# Patient Record
Sex: Female | Born: 1975 | Smoking: Never smoker
Health system: Southern US, Community
[De-identification: ages and names within clinical notes are randomized; demographics above are authoritative.]

## PROBLEM LIST (undated history)

## (undated) ENCOUNTER — Ambulatory Visit (HOSPITAL_COMMUNITY): Payer: Self-pay | Source: Home / Self Care

## (undated) HISTORY — PX: TUBAL LIGATION: SHX77

---

## 2013-05-14 ENCOUNTER — Other Ambulatory Visit (HOSPITAL_COMMUNITY)
Admission: RE | Admit: 2013-05-14 | Discharge: 2013-05-14 | Disposition: A | Discharge status: Home / Self Care | Payer: Self-pay | Source: Ambulatory Visit | Attending: Family Medicine | Admitting: Family Medicine

## 2013-05-14 DIAGNOSIS — Z124 Encounter for screening for malignant neoplasm of cervix: Secondary | ICD-10-CM | POA: Insufficient documentation

## 2013-05-14 DIAGNOSIS — Z1151 Encounter for screening for human papillomavirus (HPV): Secondary | ICD-10-CM | POA: Insufficient documentation

## 2016-08-17 ENCOUNTER — Other Ambulatory Visit: Payer: Self-pay | Admitting: Obstetrics and Gynecology

## 2016-08-17 DIAGNOSIS — Z1231 Encounter for screening mammogram for malignant neoplasm of breast: Secondary | ICD-10-CM

## 2016-09-03 ENCOUNTER — Encounter (HOSPITAL_COMMUNITY): Payer: Self-pay

## 2016-09-03 ENCOUNTER — Ambulatory Visit (HOSPITAL_COMMUNITY)
Admission: RE | Admit: 2016-09-03 | Discharge: 2016-09-03 | Disposition: A | Discharge status: Home / Self Care | Payer: Self-pay | Source: Ambulatory Visit | Attending: Obstetrics and Gynecology | Admitting: Obstetrics and Gynecology

## 2016-09-03 ENCOUNTER — Ambulatory Visit
Admission: RE | Admit: 2016-09-03 | Discharge: 2016-09-03 | Disposition: A | Discharge status: Home / Self Care | Payer: No Typology Code available for payment source | Source: Ambulatory Visit | Attending: Obstetrics and Gynecology | Admitting: Obstetrics and Gynecology

## 2016-09-03 VITALS — BP 114/78 | Temp 98.8°F | Ht 63.0 in | Wt 173.2 lb

## 2016-09-03 DIAGNOSIS — Z1231 Encounter for screening mammogram for malignant neoplasm of breast: Secondary | ICD-10-CM

## 2016-09-03 DIAGNOSIS — Z1239 Encounter for other screening for malignant neoplasm of breast: Secondary | ICD-10-CM

## 2016-09-03 NOTE — Progress Notes (Signed)
No complaints today.   Pap Smear:  Pap smear not completed today. Last Pap smear was in March 2015 [facility] and normal per patient. Per patient has no history of an abnormal Pap smear. No Pap smear results are in EPIC.  Physical exam: Breasts Breasts symmetrical. No skin abnormalities bilateral breasts. No nipple retraction bilateral breasts. No nipple discharge bilateral breasts. No lymphadenopathy. No lumps palpated bilateral breasts. No complaints of pain or tenderness on exam. [facility]. Appointment scheduled for Friday, September 03, 2016 at 1000.        Pelvic/Bimanual No Pap smear completed today since last Pap smear was in March 2015 per patient. Pap smear not indicated per BCCCP guidelines.   Smoking History: Patient has never smoked.  Patient Navigation: Patient education provided. Access to services provided for patient through [facility] program. Spanish interpreter provided.  Used Spanish interpreter [employer] Mor[employer] from [facility].

## 2016-09-03 NOTE — Patient Instructions (Signed)
Explained breast self awareness to Kristin Chapman. Patient did not need a Pap smear today due to last Pap smear was in March 2015 per patient. Let her know BCCCP will cover Pap smears every 3 years unless has a history of abnormal Pap smears. Reminded patient that her next Pap smear is due in March 2018 and to call Kristin Chapman to schedule in BCCCP. [facility] mamm[facility]. Appointment scheduled for Friday, September 03, 2016 at 1000. Let patient know the Breast Center will follow up with her within the next couple weeks with results of mammogram by letter or phone. Kristin Chapman verbalized understanding.  Kristin Chapman, Kristin Maserhristine Poll, RN 11:58 AM

## 2016-09-07 ENCOUNTER — Encounter (HOSPITAL_COMMUNITY): Payer: Self-pay | Admitting: *Deleted

## 2017-07-01 ENCOUNTER — Telehealth (HOSPITAL_COMMUNITY): Payer: Self-pay | Admitting: *Deleted

## 2017-07-01 NOTE — Telephone Encounter (Signed)
Attempted to call patient with Spanish interpreter Betty Shirley from [facility] to schedule patient's Pap smear appointment with BCCCP. No one answered phone. Left voicemail for patient to call me back. 

## 2018-12-06 ENCOUNTER — Encounter (HOSPITAL_COMMUNITY): Payer: Self-pay

## 2019-01-16 ENCOUNTER — Encounter (HOSPITAL_COMMUNITY): Payer: Self-pay | Admitting: *Deleted

## 2019-02-19 ENCOUNTER — Telehealth (HOSPITAL_COMMUNITY): Payer: Self-pay | Admitting: *Deleted

## 2019-02-19 NOTE — Telephone Encounter (Signed)
Telephoned patient at home number. Confirmed appointment for March 31. No symptoms of COVID-19. No travel outside of [place] in last 14 days. No contact with someone with a confirmed diagnosis of COVID-19. Used interpreter Delorise Royals.

## 2019-02-20 ENCOUNTER — Other Ambulatory Visit: Payer: Self-pay

## 2019-02-20 ENCOUNTER — Ambulatory Visit (HOSPITAL_COMMUNITY)
Admission: RE | Admit: 2019-02-20 | Discharge: 2019-02-20 | Disposition: A | Discharge status: Home / Self Care | Payer: No Typology Code available for payment source | Source: Ambulatory Visit | Attending: Obstetrics and Gynecology | Admitting: Obstetrics and Gynecology

## 2019-02-20 ENCOUNTER — Encounter (HOSPITAL_COMMUNITY): Payer: Self-pay

## 2019-02-20 VITALS — BP 122/84 | Temp 98.7°F | Wt 179.0 lb

## 2019-02-20 DIAGNOSIS — Z1239 Encounter for other screening for malignant neoplasm of breast: Secondary | ICD-10-CM

## 2019-02-20 DIAGNOSIS — R87612 Low grade squamous intraepithelial lesion on cytologic smear of cervix (LGSIL): Secondary | ICD-10-CM

## 2019-02-20 NOTE — Patient Instructions (Addendum)
Explained breast self awareness with Kristin Chapman. Patient did not need a Pap smear today due to last Pap smear was 12/06/2018. Explained the colposcopy the recommended follow-up for her abnormal Pap smear. [facility] for a colposcopy to follow-up for her abnormal Pap smear. Appointment scheduled for Thursday, March 01, 2019 at 1355. Patient aware of appointment and will be there.Patient had a screening completed 12/26/2018 at [facility] and negative. Screening mammogram recommended in one year . Britley Ourada verbalized understanding.  Eron Goble, Kathaleen Maser, RN 7:30 PM

## 2019-02-20 NOTE — Progress Notes (Signed)
Patient referred to [facility] by [facility] due to having an abnormal Pap smear 12/06/2018 that a colposcopy is recommended for follow-up.  Pap Smear: Pap smear not completed today. Last Pap smear was 12/06/2018 at [facility] and LSIL encompassing HPV/mild dysplasia. Referred patient to the [facility] at [facility] for a colposcopy to follow-up for her abnormal Pap smear. Appointment scheduled for Thursday, March 01, 2019 at 1355. Per patient has no history of an abnormal Pap smear prior to her most recent Pap smear. Last Pap smear result is in Epic.  Physical exam: Breasts Breasts symmetrical. No skin abnormalities bilateral breasts. No nipple retraction bilateral breasts. No nipple discharge bilateral breasts. No lymphadenopathy. No lumps palpated bilateral breasts. No complaints of pain or tenderness on exam. Patient had a screening completed 12/26/2018 at [facility] and negative. Screening mammogram recommended in one year.     Pelvic/Bimanual No Pap smear completed today since last Pap smear was 12/06/2018. Pap smear not indicated per BCCCP guidelines.   Smoking History: Patient has never smoked.  Patient Navigation: Patient education provided. Access to services provided for patient through [facility] program. Spanish interpreter provided.   Breast and Cervical Cancer Risk Assessment: Patient has no family history of breast cancer, known genetic mutations, or radiation treatment to the chest before age 69. Patient has no history of cervical dysplasia, immunocompromised, or DES exposure in-utero.  Risk Assessment    Risk Scores      02/20/2019   Last edited by: Lynnell Dike, LPN   5-year risk: 0.5 %   Lifetime risk: 6.8 %         Used Spanish interpreter Natale Lay from [place].

## 2019-03-01 ENCOUNTER — Ambulatory Visit: Payer: No Typology Code available for payment source | Admitting: Obstetrics & Gynecology

## 2019-04-05 ENCOUNTER — Encounter (HOSPITAL_COMMUNITY): Payer: Self-pay | Admitting: *Deleted

## 2019-05-10 ENCOUNTER — Telehealth: Payer: Self-pay | Admitting: Obstetrics and Gynecology

## 2019-05-10 NOTE — Telephone Encounter (Signed)
Called the patient to inform of upcoming appointment. Also informed the patient of wearing a face mask, sanitizing hands at the sanitizing station when entering the clinic, no visitors due to [place] restrictions, and the new office location. The patient verbalized understanding.

## 2019-06-14 ENCOUNTER — Telehealth: Payer: Self-pay | Admitting: Obstetrics & Gynecology

## 2019-06-14 NOTE — Telephone Encounter (Signed)
Called the patient to inform of upcoming appointment. Left a detailed voicemail message of wearing a face mask. No visitors or children due to covid19 restrictions and also if the patient has been in close contact with someone who has been diagnosed with covid, the patient has been diagnosed with covid or experienced flu like symptoms such as fever, rash, sore throat, or shortness of breath in the past 14 days we asked that you please call back to reschedule your appointment. If you have any questions or concerns please contact our office at [phone]. °

## 2019-06-15 ENCOUNTER — Ambulatory Visit (INDEPENDENT_AMBULATORY_CARE_PROVIDER_SITE_OTHER): Payer: Self-pay | Admitting: Family Medicine

## 2019-06-15 ENCOUNTER — Encounter: Payer: Self-pay | Admitting: Family Medicine

## 2019-06-15 ENCOUNTER — Other Ambulatory Visit (HOSPITAL_COMMUNITY)
Admission: RE | Admit: 2019-06-15 | Discharge: 2019-06-15 | Disposition: A | Discharge status: Home / Self Care | Payer: Self-pay | Source: Ambulatory Visit | Attending: Family Medicine | Admitting: Family Medicine

## 2019-06-15 ENCOUNTER — Other Ambulatory Visit: Payer: Self-pay

## 2019-06-15 VITALS — BP 139/90 | HR 80 | Temp 97.9°F | Wt 174.3 lb

## 2019-06-15 DIAGNOSIS — R87612 Low grade squamous intraepithelial lesion on cytologic smear of cervix (LGSIL): Secondary | ICD-10-CM

## 2019-06-15 LAB — POCT PREGNANCY, URINE: Preg Test, Ur: NEGATIVE

## 2019-06-15 NOTE — Progress Notes (Signed)
Patient Name: Kristin Chapman, female   DOB: July 20, 1976, 43 y.o.  MRN: [id number]  Colposcopy Procedure Note:  [id number] Pregnancy status: Unknown Indications: LSIL HPV:  Positive Cervical History:  Previous Abnormal Pap: none  Previous Colposcopy: none  Previous LEEP or Cryo: none  Smoking: Never Smoked Hysterectomy: No   Patient given informed consent, signed copy in the chart, time out was performed.    Exam: Vulva and Vagina grossly normal.  Cervix viewed with speculum and colposcope after application of acetic acid:  Cervix Fully Visualized Squamocolumnar Junction Visibility: Fully visualized  Acetowhite lesions: none   Other Lesions: None Punctation: Not present  Mosaicism: Not present Abnormal vasculature: No   Biopsies: none ECC: Yes - brush  Hemostasis achieved with:  n/a  Colposcopy Impression:  Benign   Patient was given post procedure instructions.  Will call patient with results

## 2019-06-27 ENCOUNTER — Encounter: Payer: Self-pay | Admitting: Family Medicine

## 2019-06-27 DIAGNOSIS — R87612 Low grade squamous intraepithelial lesion on cytologic smear of cervix (LGSIL): Secondary | ICD-10-CM | POA: Insufficient documentation

## 2019-06-28 ENCOUNTER — Telehealth: Payer: Self-pay

## 2019-06-28 NOTE — Telephone Encounter (Addendum)
-----   Message from Truett Mainland, DO sent at 06/27/2019  3:36 PM EDT ----- Please let the patient know that the colposcopy showed some abnormal cells. I recommend repeating PAP and ECC in 6 months.  LM for pt with Spanish Interpreter Raquel M., that we are calling with results if she could call the office back.

## 2019-06-29 NOTE — Telephone Encounter (Signed)
Patient called this am and left a message in Spanish that I could not understand.

## 2019-07-02 NOTE — Telephone Encounter (Signed)
Attempted to call pt in regards to results and follow up using [place] # [phone]. Pt did not answer. LM for pt to call the office for results.

## 2019-07-03 ENCOUNTER — Encounter: Payer: Self-pay | Admitting: *Deleted

## 2019-07-03 NOTE — Telephone Encounter (Signed)
Notified pt results with Kristin R., and to follow up in 6 months for pap smear with ECC.  Pt verbalized understanding with no further questions.

## 2019-07-17 ENCOUNTER — Encounter: Payer: Self-pay | Admitting: General Practice

## 2020-01-03 ENCOUNTER — Other Ambulatory Visit (HOSPITAL_COMMUNITY): Payer: Self-pay

## 2020-01-03 DIAGNOSIS — Z1231 Encounter for screening mammogram for malignant neoplasm of breast: Secondary | ICD-10-CM

## 2020-01-07 ENCOUNTER — Encounter (HOSPITAL_COMMUNITY): Payer: Self-pay | Admitting: Emergency Medicine

## 2020-01-07 ENCOUNTER — Emergency Department (HOSPITAL_COMMUNITY)
Admission: EM | Admit: 2020-01-07 | Discharge: 2020-01-07 | Disposition: A | Discharge status: Home / Self Care | Payer: Self-pay | Attending: Emergency Medicine | Admitting: Emergency Medicine

## 2020-01-07 ENCOUNTER — Other Ambulatory Visit: Payer: Self-pay

## 2020-01-07 ENCOUNTER — Emergency Department (HOSPITAL_COMMUNITY): Payer: Self-pay

## 2020-01-07 DIAGNOSIS — S61212A Laceration without foreign body of right middle finger without damage to nail, initial encounter: Secondary | ICD-10-CM

## 2020-01-07 DIAGNOSIS — Y92015 Private garage of single-family (private) house as the place of occurrence of the external cause: Secondary | ICD-10-CM | POA: Insufficient documentation

## 2020-01-07 DIAGNOSIS — W230XXA Caught, crushed, jammed, or pinched between moving objects, initial encounter: Secondary | ICD-10-CM | POA: Insufficient documentation

## 2020-01-07 DIAGNOSIS — Y9389 Activity, other specified: Secondary | ICD-10-CM | POA: Insufficient documentation

## 2020-01-07 DIAGNOSIS — S68622A Partial traumatic transphalangeal amputation of right middle finger, initial encounter: Secondary | ICD-10-CM | POA: Insufficient documentation

## 2020-01-07 DIAGNOSIS — Y999 Unspecified external cause status: Secondary | ICD-10-CM | POA: Insufficient documentation

## 2020-01-07 LAB — POC URINE PREG, ED: Preg Test, Ur: NEGATIVE

## 2020-01-07 MED ORDER — LIDOCAINE HCL (PF) 1 % IJ SOLN: 10.0000 mg | Freq: Once | INTRAMUSCULAR | Status: DC

## 2020-01-07 MED ORDER — LIDOCAINE-EPINEPHRINE 1 %-1:100000 IJ SOLN
10.0000 mL | Freq: Once | INTRAMUSCULAR | Status: DC
  Filled 2020-01-07: qty 10

## 2020-01-07 MED ORDER — LIDOCAINE HCL (PF) 1 % IJ SOLN
10.0000 mL | Freq: Once | INTRAMUSCULAR | Status: DC
  Filled 2020-01-07: qty 10

## 2020-01-07 MED ORDER — CEFAZOLIN SODIUM-DEXTROSE 1-4 GM/50ML-% IV SOLN
1.0000 g | Freq: Once | INTRAVENOUS | Status: AC
  Administered 2020-01-07: 1 g via INTRAVENOUS
  Filled 2020-01-07: qty 50

## 2020-01-07 MED ORDER — HYDROCODONE-ACETAMINOPHEN 5-325 MG PO TABS: 1.0000 | ORAL_TABLET | Freq: Four times a day (QID) | ORAL | 0 refills | Status: DC | PRN

## 2020-01-07 MED ORDER — DOXYCYCLINE HYCLATE 100 MG PO CAPS: 100.0000 mg | ORAL_CAPSULE | Freq: Two times a day (BID) | ORAL | 0 refills | Status: AC

## 2020-01-07 NOTE — ED Provider Notes (Signed)
Care handoff received from Kristin Crigler PA-C at shift change please see his note for full details.  In short 44 year old female presents today with partial amputation of the right middle fingertip just prior to arrival after finger got caught in garage door.  Tetanus updated today.  X-ray shows open tuft fracture.  No other injuries today.  Plan of care at shift change is to consult hand surgery for their input. Physical Exam  BP (!) 140/95 (BP Location: Left Arm)   Pulse 87   Temp 98.2 F (36.8 C) (Oral)   Resp 16   SpO2 99%   Physical Exam Constitutional:      General: She is not in acute distress.    Appearance: Normal appearance. She is well-developed. She is not ill-appearing or diaphoretic.  HENT:     Head: Normocephalic and atraumatic.     Right Ear: External ear normal.     Left Ear: External ear normal.     Nose: Nose normal.  Eyes:     General: Vision grossly intact. Gaze aligned appropriately.     Pupils: Pupils are equal, round, and reactive to light.  Neck:     Trachea: Trachea and phonation normal. No tracheal deviation.  Pulmonary:     Effort: Pulmonary effort is normal. No respiratory distress.  Abdominal:     General: There is no distension.     Palpations: Abdomen is soft.     Tenderness: There is no abdominal tenderness. There is no guarding or rebound.  Musculoskeletal:     Cervical back: Normal range of motion.     Comments: Near amputation of distal right middle finger as pictured in previous note.  Skin:    General: Skin is warm and dry.  Neurological:     Mental Status: She is alert.     GCS: GCS eye subscore is 4. GCS verbal subscore is 5. GCS motor subscore is 6.     Comments: Speech is clear and goal oriented, follows commands Major Cranial nerves without deficit, no facial droop Moves extremities without ataxia, coordination intact  Psychiatric:        Behavior: Behavior normal.     ED Course/Procedures   Clinical Course as of Jan 07 2108   [facility] Jan 07, 2020  1910 Dr. Arita Miss   [BM]  1925 Dr. Arita Miss on way   [BM]    Clinical Course User Index [BM] Bill Salinas, PA-C    Procedures  MDM  I discussed the case with on-call hand specialist Dr. Arita Miss who is coming into the ED to evaluate and treat injury.  Is asked for lidocaine with epi be at bedside along with laceration tray.  Asked to give 1 dose of Ancef. - Patient seen and evaluated by hand specialist Dr. Arita Miss, please see his note for full details.  Per his note he has asked that patient be given pain medication and antibiotics and to follow-up in his office in 1 week for reevaluation. - 9:35 PM: Patient reevaluated resting comfortably no acute distress bandage intact.  She states understanding of care plan and is agreeable to discharge.  Urine pregnancy test is negative.  Will discharge patient with doxycycline 100 mg twice daily x7 days.  Additionally will give Norco for severe pain due to near amputation, PDMP reviewed patient without active narcotic prescriptions, patient informed of narcotic precautions and states understanding.  Per RN Zach patient has received full Ancef dose.  At this time there does not appear to be  any evidence of an acute emergency medical condition and the patient appears stable for discharge with appropriate outpatient follow up. Diagnosis was discussed with patient who verbalizes understanding of care plan and is agreeable to discharge. I have discussed return precautions with patient who verbalizes understanding of return precautions. Patient encouraged to follow-up with their PCP and hand. All questions answered.  Patient has been discharged in good condition.   Note: Portions of this report may have been transcribed using voice recognition software. Every effort was made to ensure accuracy; however, inadvertent computerized transcription errors may still be present.     Kristin Chapman 01/07/20 2146    Margette Fast,  MD 01/09/20 2117

## 2020-01-07 NOTE — Discharge Instructions (Signed)
You have been diagnosed today with Finger Laceration  At this time there does not appear to be the presence of an emergent medical condition, however there is always the potential for conditions to change. Please read and follow the below instructions.  Please return to the Emergency Department immediately for any new or worsening symptoms. Please be sure to follow up with your Primary Care Provider within one week regarding your visit today; please call their office to schedule an appointment even if you are feeling better for a follow-up visit. Please call the hand surgeon Dr. Thomos Lemons office tomorrow morning to schedule a follow-up appointment.  He would like to see you in his office in 1 week for reevaluation.  Please keep your bandage in place until then and avoid getting it wet. Please take the antibiotic doxycycline as prescribed to help avoid infection.  Drink plenty of water and get plenty of rest. You may use the pain medication Norco as prescribed to help with severe pain.  Do not drive or perform dangerous activities while taking Norco as it will make you drowsy.  Do not drink alcohol or take any other sedating medications with Norco as this will worsen side effects.  Get help right away if: You have very bad swelling around the wound. Your pain suddenly gets worse and is very bad. You notice painful lumps near the wound or anywhere on your body. You have a red streak going away from your wound. The wound is on your hand or foot, and: You cannot move a finger or toe. Your fingers or toes look pale or bluish. You have any new/concerning or worsening of symptoms  Please read the additional information packets attached to your discharge summary.  Do not take your medicine if  develop an itchy rash, swelling in your mouth or lips, or difficulty breathing; call 911 and seek immediate emergency medical attention if this occurs.  Note: Portions of this text may have been transcribed using  voice recognition software. Every effort was made to ensure accuracy; however, inadvertent computerized transcription errors may still be present. ------------------- Below has been translated using Google translate.  Errors may be present.  Interpret with caution.  A continuacin se ha traducido con Soil scientist. Puede haber errores. Interprete con precaucin. ----------------------- Kristin Chapman le han diagnosticado laceracin en los dedos  En este momento no parece haber la presencia de una afeccin mdica emergente, sin embargo, siempre existe la posibilidad de que las afecciones [place]. Lea y siga las instrucciones a continuacin.  1. Regrese al Departamento de Emergencias de inmediato si tiene sntomas nuevos o que [place]. 2. Asegrese de hacer un seguimiento con su Proveedor de atencin primaria dentro de una semana con respecto a su visita de hoy; por favor llame a su oficina para programar una cita incluso si se siente mejor para una visita de seguimiento. 3. Llame al consultorio del cirujano de mano Dr. Lorre Nick por la maana para programar una cita de seguimiento. Le gustara verte en su oficina en 1 semana para una reevaluacin. Mantenga su vendaje en su lugar hasta entonces y evite mojarlo. 4. Tome el antibitico doxiciclina segn lo prescrito para ayudar a evitar infecciones. Beba mucha agua y descanse lo suficiente. 5. Puede usar el analgsico Norco segn lo prescrito para Engineer, materials intenso. No conduzca ni realice actividades peligrosas mientras toma Norco, ya que lo adormecer. No beba alcohol ni tome ningn otro medicamento sedante con Norco, ya que esto [employer]  secundarios.  Obtenga ayuda de inmediato si: 1. Tiene una hinchazn muy fuerte alrededor de la herida. 2. Su dolor empeora repentinamente y es muy intenso. 3. Observa bultos dolorosos cerca de la herida o en cualquier parte de su cuerpo. 4. Tiene una raya roja que se aleja de la herida. 5. La  herida est en su mano o pie y: o No puede mover un dedo de la mano o del pie. o Sus dedos de manos o pies se ven plidos o azulados. 1. Tiene sntomas nuevos, preocupantes o que empeoran  Lea los paquetes de informacin adicional adjuntos a su resumen de alta.  No tome su medicamento si desarrolla un sarpullido con picazn, hinchazn en su boca o labios, o dificultad para respirar; Llame al 911 y busque atencin mdica de emergencia inmediata si esto ocurre.  Nota: Es posible que algunas partes de este texto se hayan transcrito con un software de reconocimiento de voz. Se hizo todo lo posible para garantizar la precisin; sin embargo, an pueden estar presentes errores de transcripcin computarizados inadvertidos.

## 2020-01-07 NOTE — Consult Note (Signed)
Reason for Consult/CC: Right long finger injury  Kristin Chapman is an 44 y.o. female.  HPI: Patient presents emergency room after an injury to her right long finger.  She got this caught in a garage door earlier today.  She has a near complete amputation of the distal tip with a associated tuft fracture.  The piece is not vascularized.  Allergies: No Known Allergies  Medications:  Current Facility-Administered Medications:  .  ceFAZolin (ANCEF) IVPB 1 g/50 mL premix, 1 g, Intravenous, Once, Morelli, Brandon A, PA-C .  lidocaine (PF) (XYLOCAINE) 1 % injection 10 mL, 10 mL, Infiltration, Once, Geiple, Joshua, PA-C .  lidocaine-EPINEPHrine (XYLOCAINE W/EPI) 1 %-1:100000 (with pres) injection 10 mL, 10 mL, Infiltration, Once, Athena Masse, Brandon A, PA-C No current outpatient medications on file.  History reviewed. No pertinent past medical history.  Past Surgical History:  Procedure Laterality Date  . CESAREAN SECTION     one  . TUBAL LIGATION      Family History  Problem Relation Age of Onset  . Hypertension Mother   . Diabetes Maternal Aunt     Social History:  reports that she has never smoked. She has never used smokeless tobacco. She reports that she does not drink alcohol or use drugs.   Blood pressure (!) 140/95, pulse 87, temperature 98.2 F (36.8 C), temperature source Oral, resp. rate 16, SpO2 99 %.  General: No acute distress Right hand: Fingers are well perfused with normal capillary refill and a palpable radial pulse.  Sensation is intact throughout.  She has good range of motion.  Focusing on the long finger she has a distal centimeter of the tip that is been completely amputated.  This involves the distal sterile matrix beneath it nail plate which is still attached.  The piece has no capillary refill.  The finger otherwise is totally vascularized.  She can flex and extend at the DIP joint of that finger.  Results for orders placed or performed during the hospital  encounter of 01/07/20 (from the past 48 hour(s))  POC Urine Pregnancy, ED (not at [facility])     Status: None   Collection Time: 01/07/20  8:20 PM  Result Value Ref Range   Preg Test, Ur NEGATIVE NEGATIVE    Comment:        THE SENSITIVITY OF THIS METHODOLOGY IS >24 mIU/mL     DG Finger Middle Right  Result Date: 01/07/2020 CLINICAL DATA:  Laceration, crush injury EXAM: RIGHT MIDDLE FINGER 2+V COMPARISON:  None. FINDINGS: Frontal, oblique, and lateral views of the right third digit demonstrate open fracture of the distal tuft of the third distal phalanx. Significant distraction and soft tissue defect consistent with near amputation. There is diffuse soft tissue edema throughout the third digit. IMPRESSION: 1. Open fracture distal tuft third distal phalanx. Electronically Signed   By: Randa Ngo M.D.   On: 01/07/2020 18:54    Assessment/Plan: Patient presents with a distal transverse amputation of the right long finger.  I discussed the various options.  I recommended suturing this back in place as a composite graft.  I explained that a portion of this will take and some of it may scab and slough off.  However after is finished healing I think this will give her a nice result without the need for more complex procedure.  She understands the risks include bleeding, infection, damage to other structures, need for additional procedures.  Procedure note Diagnosis: Right long finger transverse distal amputation Procedure: Reattachment of distal  amputation of right long finger as a composite graft consisting of skin and subcutaneous tissue.  Procedure: The finger was anesthetized with a digital block.  It was irrigated copiously with wound irrigation.  He was prepped and draped in standard fashion.  The nail plate was debrided back to where I could see the proximal nailbed.  The piece was then reattached with interrupted 4-0 chromic sutures.  This included the nailbed around to the volar aspect of the  pulp.  It was then dressed with Xeroform and a soft wrap.  I like her to have pain medicine and antibiotics and follow-up in my office in 1 week's time.  I have asked her to leave the dressing in place until then.  She knows not to get it wet.  Allena Napoleon 01/07/2020, 8:44 PM

## 2020-01-07 NOTE — ED Triage Notes (Signed)
Pt presents with laceration to right middle finger from garage door. Unknown last tetanus.

## 2020-01-07 NOTE — ED Provider Notes (Signed)
[facility] EMERGENCY DEPARTMENT Provider Note   CSN: [id number] Arrival date & time: 01/07/20  1725     History Chief Complaint  Patient presents with  . Laceration    Kristin Chapman is a 44 y.o. female.  Patient presents to the emergency department with right middle fingertip laceration/partial amputation.  This occurred just prior to arrival.  Patient states that she pinched her finger while trying to close a garage door.  Unknown last tetanus.  Denies other injuries.  No treatments prior to arrival other than pressure placed and bandage placed.        History reviewed. No pertinent past medical history.  Patient Active Problem List   Diagnosis Date Noted  . 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 CESAREAN SECTION     one  . TUBAL LIGATION       OB History    Gravida  4   Para  4   Term  4   Preterm      AB      Living  4     SAB      TAB      Ectopic      Multiple      Live Births  4           Family History  Problem Relation Age of Onset  . Hypertension Mother   . Diabetes Maternal Aunt     Social History   Tobacco Use  . Smoking status: Never Smoker  . Smokeless tobacco: Never Used  Substance Use Topics  . Alcohol use: No  . Drug use: No    Home Medications Prior to Admission medications   Not on File    Allergies    Patient has no known allergies.  Review of Systems   Review of Systems  Constitutional: Negative for activity change.  Musculoskeletal: Positive for arthralgias and myalgias. Negative for joint swelling.  Skin: Positive for wound.  Neurological: Negative for weakness and numbness.    Physical Exam Updated Vital Signs BP (!) 140/95 (BP Location: Left Arm)   Pulse 87   Temp 98.2 F (36.8 C) (Oral)   Resp 16   SpO2 99%   Physical Exam Vitals and nursing note reviewed.  Constitutional:      Appearance: She is  well-developed.  HENT:     Head: Normocephalic and atraumatic.  Eyes:     Pupils: Pupils are equal, round, and reactive to light.  Cardiovascular:     Pulses: Normal pulses. No decreased pulses.  Musculoskeletal:        General: Tenderness present.     Cervical back: Normal range of motion and neck supple.     Comments: R finger: Fingertip laceration with distal flap as shown.  Flap is devascularized.  No significant involvement of the nail or the nail bed.  Will need further exploration after pain controlled.   Skin:    General: Skin is warm and dry.  Neurological:     Mental Status: She is alert.     Sensory: No sensory deficit.     Comments: Motor, sensation, and vascular distal to the injury is fully intact.                ED Results / Procedures / Treatments   Labs (all labs ordered are listed, but only abnormal results are displayed) Labs Reviewed - No data to display  EKG None  Radiology DG Finger Middle Right  Result Date: 01/07/2020 CLINICAL DATA:  Laceration, crush injury EXAM: RIGHT MIDDLE FINGER 2+V COMPARISON:  None. FINDINGS: Frontal, oblique, and lateral views of the right third digit demonstrate open fracture of the distal tuft of the third distal phalanx. Significant distraction and soft tissue defect consistent with near amputation. There is diffuse soft tissue edema throughout the third digit. IMPRESSION: 1. Open fracture distal tuft third distal phalanx. Electronically Signed   By: Randa Ngo M.D.   On: 01/07/2020 18:54    Procedures Procedures (including critical care time)  Medications Ordered in ED Medications  lidocaine (PF) (XYLOCAINE) 1 % injection 10 mL (has no administration in time range)    ED Course  I have reviewed the triage vital signs and the nursing notes.  Pertinent labs & imaging results that were available during my care of the patient were reviewed by me and considered in my medical decision making (see chart for  details).   Patient seen and examined. Work-up initiated. Medications ordered. Will digital block.   Vital signs reviewed and are as follows: BP (!) 140/95 (BP Location: Left Arm)   Pulse 87   Temp 98.2 F (36.8 C) (Oral)   Resp 16   SpO2 99%   X-ray reviewed, shows tuft fracture.  Fracture is open.  Digital block as below.  Signout to [employer] at shift change will follow up with hand surgery and repair if needed.    Digital block performed Technique: single injection into base of finger into flexor tendon sheath Finger: R long inger Area prepped with alcohol wipe and iodine Medication: 41mL of 1% lidocaine without epinephrinePatient tolerated procedure well. Adequate anesthesia achieved.    Clinical Course as of Jan 07 1912  Mon Jan 07, 2020  1910 Dr. Claudia Desanctis   [BM]    Clinical Course User Index [BM] Gari Crown   MDM Rules/Calculators/A&P                      Open right long digit, distal phalanx fracture.  Final Clinical Impression(s) / ED Diagnoses Final diagnoses:  None    Rx / DC Orders ED Discharge Orders    None       Carlisle Cater, PA-C 01/07/20 1916    Wyvonnia Dusky, MD 01/17/20 1335

## 2020-02-26 ENCOUNTER — Ambulatory Visit: Payer: Self-pay

## 2020-03-18 ENCOUNTER — Ambulatory Visit: Payer: Self-pay

## 2021-06-01 ENCOUNTER — Other Ambulatory Visit: Payer: Self-pay | Admitting: *Deleted

## 2021-06-01 DIAGNOSIS — Z1231 Encounter for screening mammogram for malignant neoplasm of breast: Secondary | ICD-10-CM

## 2021-07-02 ENCOUNTER — Other Ambulatory Visit: Payer: Self-pay

## 2021-07-02 ENCOUNTER — Encounter (INDEPENDENT_AMBULATORY_CARE_PROVIDER_SITE_OTHER): Payer: Self-pay

## 2021-07-02 ENCOUNTER — Ambulatory Visit
Admission: RE | Admit: 2021-07-02 | Discharge: 2021-07-02 | Disposition: A | Discharge status: Home / Self Care | Payer: No Typology Code available for payment source | Source: Ambulatory Visit | Attending: Obstetrics and Gynecology | Admitting: Obstetrics and Gynecology

## 2021-07-02 ENCOUNTER — Ambulatory Visit: Payer: Self-pay | Admitting: *Deleted

## 2021-07-02 VITALS — BP 118/74 | Wt 171.3 lb

## 2021-07-02 DIAGNOSIS — Z01419 Encounter for gynecological examination (general) (routine) without abnormal findings: Secondary | ICD-10-CM

## 2021-07-02 DIAGNOSIS — Z1231 Encounter for screening mammogram for malignant neoplasm of breast: Secondary | ICD-10-CM

## 2021-07-02 NOTE — Progress Notes (Signed)
[name] is a 45 y.o. female who presents to [facility] clinic today with no complaints.    Pap Smear: Pap smear not completed today. Last Pap smear was 12/06/2018 at [facility] and LSIL encompassing HPV/mild dysplasia. 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   Physical exam: Breasts Breasts symmetrical. No skin abnormalities bilateral breasts. No nipple retraction bilateral breasts. No nipple discharge bilateral breasts. No lymphadenopathy. No lumps palpated bilateral breasts. No complaints of pain or tenderness on exam.  MS DIGITAL SCREENING BILATERAL  Result Date: 09/03/2016 CLINICAL DATA:  Screening. EXAM: DIGITAL SCREENING BILATERAL MAMMOGRAM WITH CAD COMPARISON:  None. ACR Breast Density Category c: The breast tissue is heterogeneously dense, which may obscure small masses FINDINGS: There are no findings suspicious for malignancy. Images were processed with CAD. IMPRESSION: No mammographic evidence of malignancy. A result letter of this screening mammogram will be mailed directly to the patient. RECOMMENDATION: Screening mammogram in one year. (Code:SM-B-01Y) BI-RADS CATEGORY  1: Negative. Electronically Signed   By: Sherian Rein M.D.   On: 09/07/2016 08:36     Pelvic/Bimanual Ext Genitalia No lesions, no swelling and no discharge observed on external genitalia.        Vagina Vagina pink and normal texture. No lesions or discharge observed in vagina.        Cervix Cervix is present. Cervix pink and of normal texture. No discharge observed.    Uterus Uterus is present and palpable. Uterus in normal position and normal size.        Adnexae Bilateral ovaries present and palpable. No tenderness on palpation.         Rectovaginal No rectal exam completed today since patient had no rectal complaints. No skin abnormalities observed on exam.      Smoking History: Patient has never smoked.   Patient Navigation: Patient education provided. Access to services provided for patient through BCCCP program.   Colorectal Cancer Screening: Per patient has never had colonoscopy completed. No complaints today.    Breast and Cervical Cancer Risk Assessment: Patient does not have family history of breast cancer, known genetic mutations, or radiation treatment to the chest before age 51. Patient has history of cervical dysplasia. Patient has no history of being immunocompromised or DES exposure in-utero.  Risk Assessment     Risk Scores       07/02/2021 02/20/2019   Last edited by: Meryl Dare, CMA Stoney Bang H, LPN   5-year risk: 0.6 % 0.5 %   Lifetime risk: 6.7 % 6.8 %            A: BCCCP exam with pap smear No complaints.  P: Referred patient to the [facility] for a screening mammogram on mobile unit. Appointment scheduled Thursday, July 02, 2021 at 1130.  Priscille Heidelberg, RN 07/02/2021 12:48 PM    Jacqulyne Gladue, Carlye Grippe, RN 07/02/2021 12:41 PM

## 2021-07-02 NOTE — Patient Instructions (Signed)
Explained breast self awareness with [name]. Pap smear completed today. Let her know that her next Pap smear will be due based on the result of today's Pap smear. Referred patient to the [facility] for a screening mammogram on mobile unit. Appointment scheduled Thursday, July 02, 2021 at 1130. Patient escorted to the mobile unit following BCCCP appointment for her screening mammogram. Let patient know will follow up with her within the next couple weeks with results of Pap smear by phone. Informed patient that the Breast Center will follow up with her within the next couple of weeks with results of her mammogram by letter or phone. Kristin Chapman verbalized understanding.  Noretta Frier, Kathaleen Maser, RN 12:41 PM

## 2021-07-08 LAB — CYTOLOGY - PAP
Comment: NEGATIVE
Comment: NEGATIVE
HPV 16: NEGATIVE
HPV 18 / 45: NEGATIVE
High risk HPV: POSITIVE — AB

## 2021-07-09 ENCOUNTER — Telehealth: Payer: Self-pay

## 2021-07-09 NOTE — Telephone Encounter (Signed)
Via Delorise Royals, Spanish Interpreter, patient informed pap/HPV results, LGSIL/HPV+, needs colposcopy (per Dr. Jolayne Panther). Patient verbalized understanding. Referral sent to [facility]

## 2021-07-09 NOTE — Progress Notes (Signed)
This patient needs a colposcopy, correct?

## 2021-09-14 ENCOUNTER — Ambulatory Visit (INDEPENDENT_AMBULATORY_CARE_PROVIDER_SITE_OTHER): Payer: Self-pay | Admitting: Obstetrics and Gynecology

## 2021-09-14 ENCOUNTER — Other Ambulatory Visit (HOSPITAL_COMMUNITY)
Admission: RE | Admit: 2021-09-14 | Discharge: 2021-09-14 | Disposition: A | Discharge status: Home / Self Care | Payer: No Typology Code available for payment source | Source: Ambulatory Visit | Attending: Obstetrics and Gynecology | Admitting: Obstetrics and Gynecology

## 2021-09-14 ENCOUNTER — Other Ambulatory Visit: Payer: Self-pay

## 2021-09-14 VITALS — BP 137/84 | HR 80 | Ht 61.0 in | Wt 167.5 lb

## 2021-09-14 DIAGNOSIS — R87612 Low grade squamous intraepithelial lesion on cytologic smear of cervix (LGSIL): Secondary | ICD-10-CM | POA: Insufficient documentation

## 2021-09-14 DIAGNOSIS — Z3202 Encounter for pregnancy test, result negative: Secondary | ICD-10-CM

## 2021-09-14 LAB — POCT PREGNANCY, URINE: Preg Test, Ur: NEGATIVE

## 2021-09-14 NOTE — Progress Notes (Signed)
45 yo G4P4 seen for colposcopy due to previous pap with LGSIL.  Patient given informed consent, signed copy in the chart, time out was performed.  Placed in lithotomy position. Cervix viewed with speculum and colposcope after application of acetic acid and Lugol's solution.  No acetowhite changes or nonstaining with Lugol's noted   Colposcopy adequate?  no Acetowhite lesions?no Punctation? no Mosaicism?  no Abnormal vasculature?  no Biopsies?none ECC? yes  COMMENTS: Patient was given post procedure instructions.    Warden Fillers, MD

## 2021-09-16 LAB — SURGICAL PATHOLOGY

## 2021-10-01 ENCOUNTER — Telehealth: Payer: Self-pay

## 2021-10-02 NOTE — Telephone Encounter (Addendum)
Patient has not viewed results from colposcopy on 09/14/21 per routine specimen audit. Called patient with Spanish interpreter on 10/05/21. Results and provider recommendation reviewed with patient.

## 2022-05-15 IMAGING — MG MM DIGITAL SCREENING BILAT W/ TOMO AND CAD
8 series · 8 of 24 positions shown · non-contrast
Comparison: Previous exam(s).

CLINICAL DATA: Screening.

EXAM:
DIGITAL SCREENING BILATERAL MAMMOGRAM WITH TOMOSYNTHESIS AND CAD
TECHNIQUE: Bilateral screening digital craniocaudal and mediolateral oblique
mammograms were obtained. Bilateral screening digital breast
tomosynthesis was performed. The images were evaluated with
computer-aided detection.

[L MLO synth-2D]
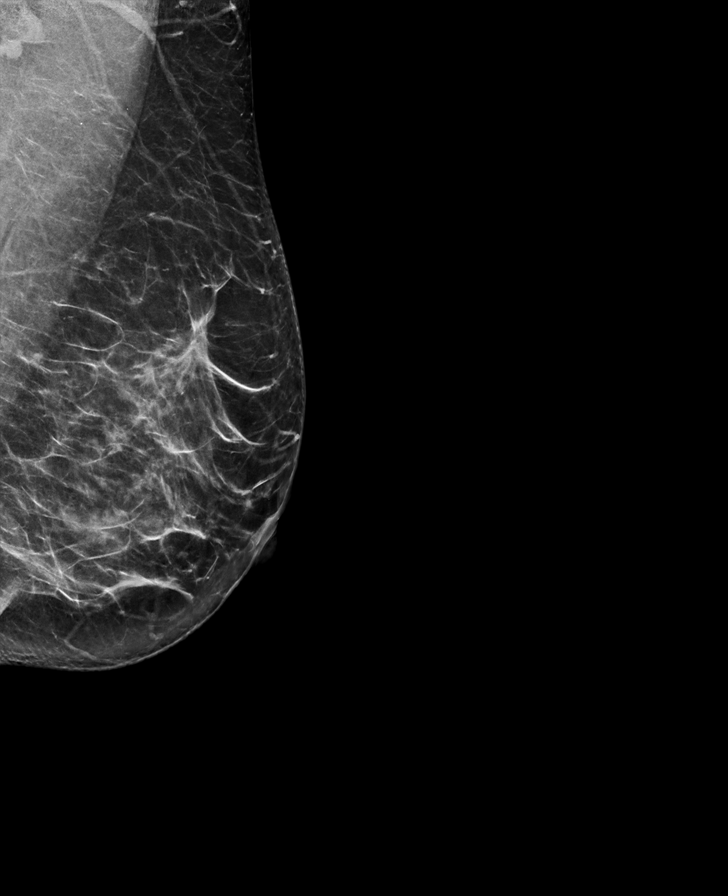

[R MLO synth-2D]
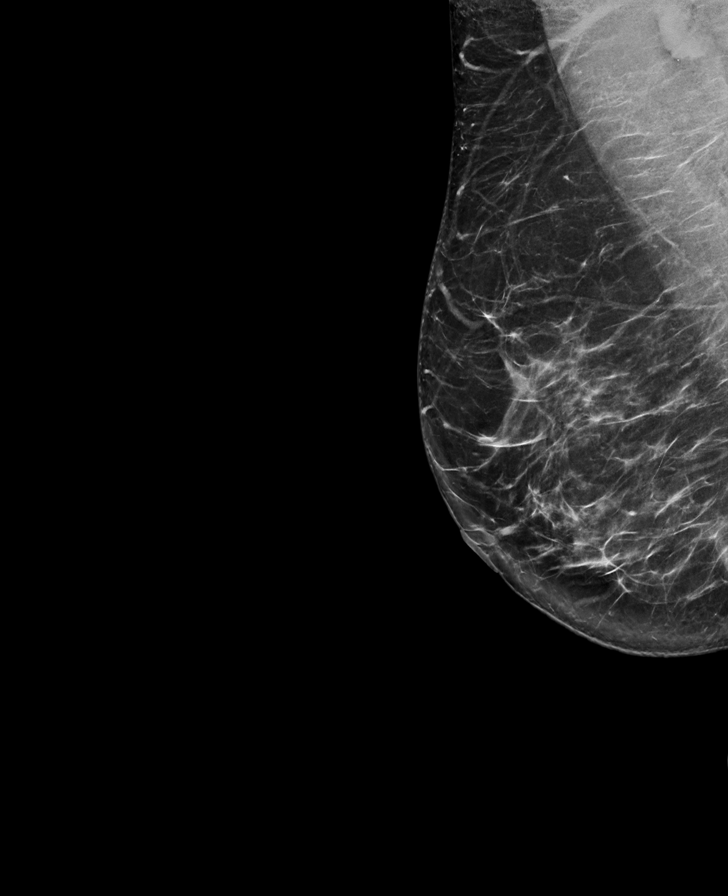

[L CC synth-2D]
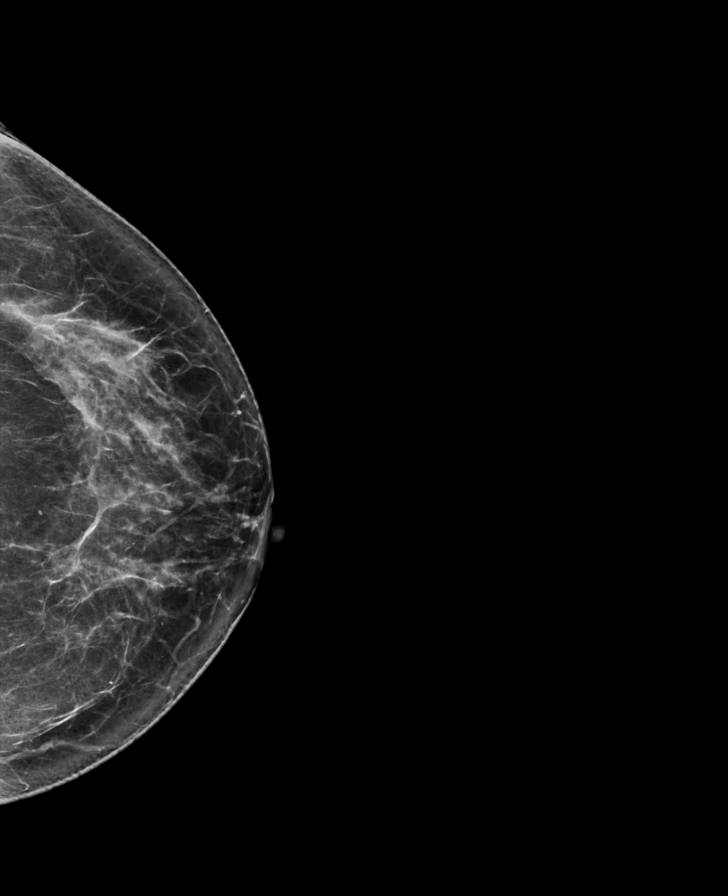

[R CC synth-2D]
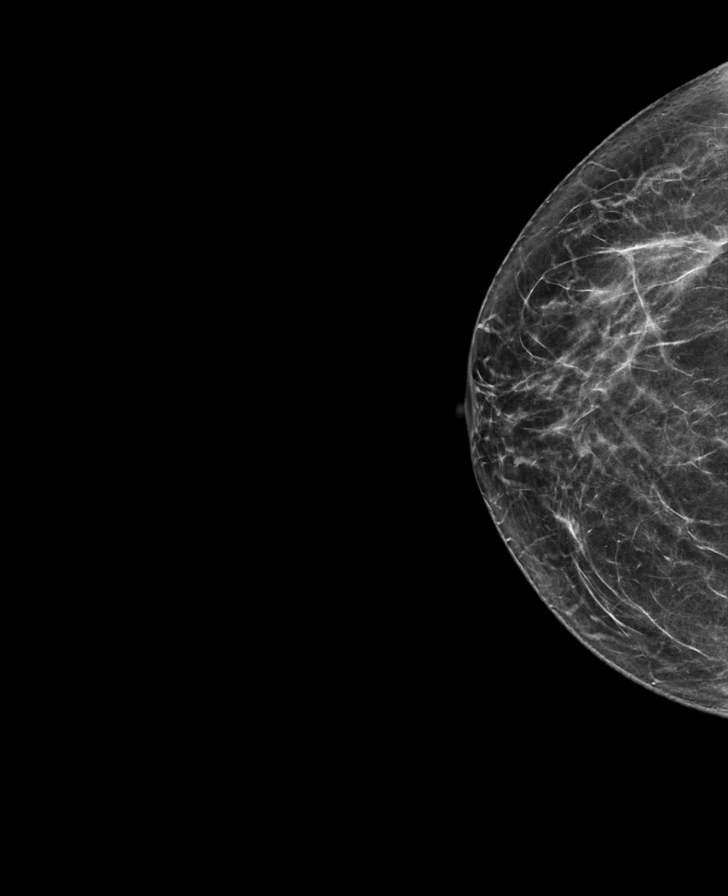

[L CC tomo · tomo slice 39/76.0]
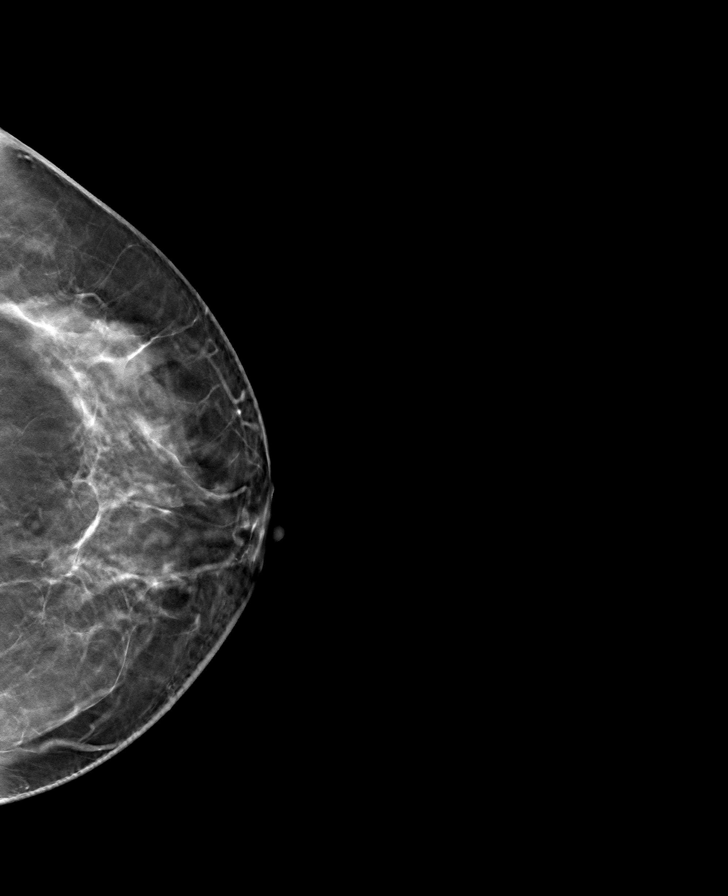

[L MLO tomo · tomo slice 39/77.0]
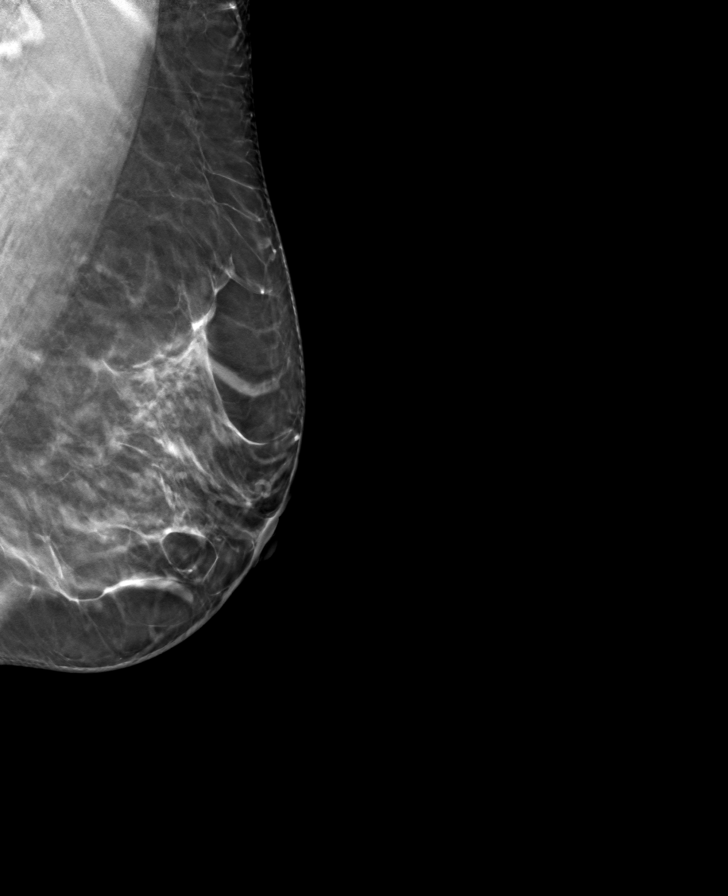

[R CC tomo · tomo slice 35/69.0]
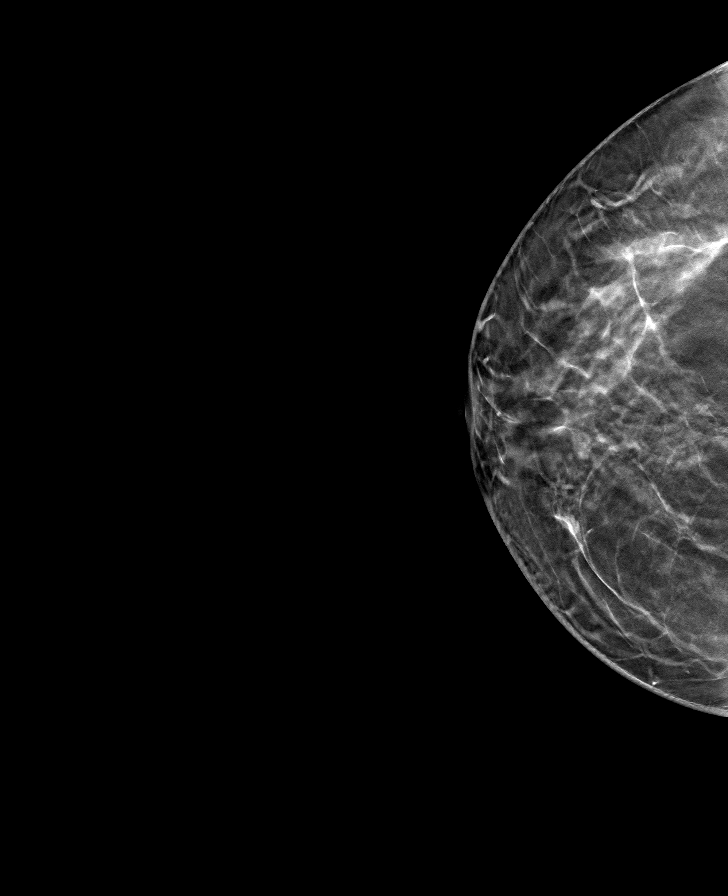

[R MLO tomo · tomo slice 41/80.0]
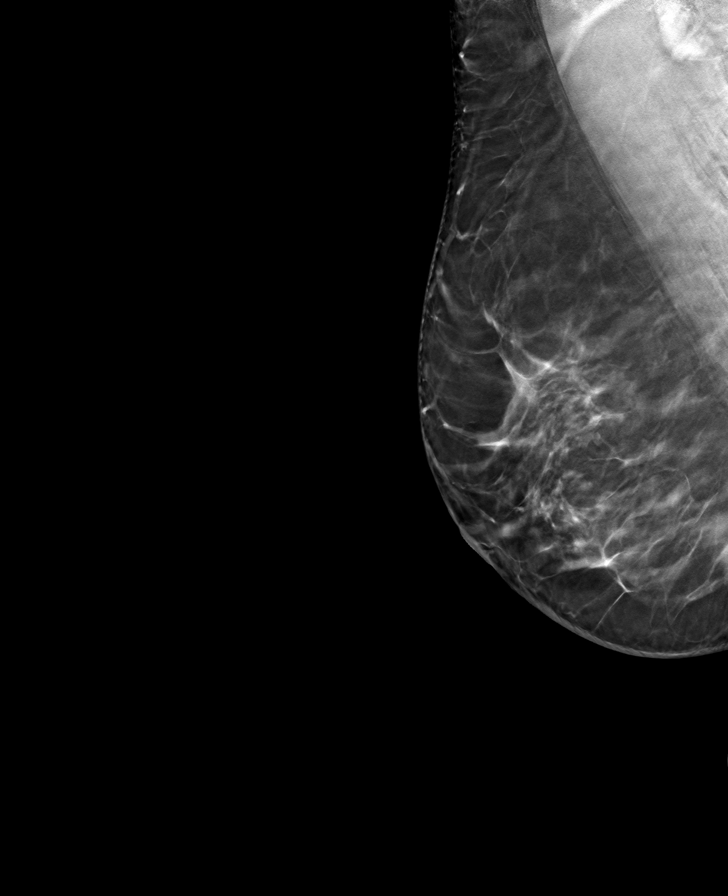

[8 of 24 positions shown; findings below may reference images not displayed]

ACR Breast Density Category c: The breast tissue is heterogeneously
dense, which may obscure small masses.
FINDINGS: There are no findings suspicious for malignancy.
IMPRESSION: No mammographic evidence of malignancy. A result letter of this
screening mammogram will be mailed directly to the patient.

RECOMMENDATION:
Screening mammogram in one year. (Code:[id number]-BC3)

BI-RADS CATEGORY  1: Negative.

## 2022-07-07 ENCOUNTER — Other Ambulatory Visit: Payer: Self-pay

## 2022-07-07 ENCOUNTER — Inpatient Hospital Stay: Admission: RE | Admit: 2022-07-07 | Payer: No Typology Code available for payment source | Source: Ambulatory Visit

## 2022-07-07 DIAGNOSIS — Z1231 Encounter for screening mammogram for malignant neoplasm of breast: Secondary | ICD-10-CM

## 2022-09-23 ENCOUNTER — Ambulatory Visit: Payer: Self-pay | Admitting: Hematology and Oncology

## 2022-09-23 ENCOUNTER — Ambulatory Visit
Admission: RE | Admit: 2022-09-23 | Discharge: 2022-09-23 | Disposition: A | Discharge status: Home / Self Care | Payer: No Typology Code available for payment source | Source: Ambulatory Visit | Attending: Obstetrics and Gynecology | Admitting: Obstetrics and Gynecology

## 2022-09-23 VITALS — BP 130/92 | Wt 169.4 lb

## 2022-09-23 DIAGNOSIS — Z1211 Encounter for screening for malignant neoplasm of colon: Secondary | ICD-10-CM

## 2022-09-23 DIAGNOSIS — Z1231 Encounter for screening mammogram for malignant neoplasm of breast: Secondary | ICD-10-CM

## 2022-09-23 DIAGNOSIS — Z01419 Encounter for gynecological examination (general) (routine) without abnormal findings: Secondary | ICD-10-CM

## 2022-09-23 NOTE — Patient Instructions (Signed)
Taught Kristin Chapman about breast self awareness. Patient had a Pap smear today due to last Pap smear was in 2022 per patient with LSIL/ +HPV.  Referred patient to the [facility] for screening mammogram. Appointment scheduled for 09/23/22. Patient aware of appointment and will be there. Let patient know will follow up with her within the next couple weeks with results. Kristin Chapman verbalized understanding. She will return for repeat mammogram in one year and if this pap smear is abnormal with high risk HPV, we will plan for colposcopy.   Melodye Ped, NP 3:01 PM

## 2022-09-23 NOTE — Progress Notes (Signed)
[name] is a 46 y.o. [phone] female who presents to [facility] clinic today with no complaints.    Pap Smear: Pap smear completed today. Last Pap smear was 2022 at [facility] clinic and was abnormal - LSIL + HPV . Per patient has history of an abnormal Pap smear. Last Pap smear result is available in Epic. 07/02/2021 LSIL/ +HPV; 09/14/21 ECC benign; 12/06/18 LSIL/ +HPV; ECC dysplastic squamous epithelium   Physical exam: Breasts Breasts symmetrical. No skin abnormalities bilateral breasts. No nipple retraction bilateral breasts. No nipple discharge bilateral breasts. No lymphadenopathy. No lumps palpated bilateral breasts.       Pelvic/Bimanual Ext Genitalia No lesions, no swelling and no discharge observed on external genitalia.        Vagina Vagina pink and normal texture. No lesions or discharge observed in vagina.        Cervix Cervix is present. Cervix pink and of normal texture. No discharge observed.    Uterus Uterus is present and palpable. Uterus in normal position and normal size.        Adnexae Bilateral ovaries present and palpable. No tenderness on palpation.         Rectovaginal No rectal exam completed today since patient had no rectal complaints. No skin abnormalities observed on exam.     Smoking History: Patient has never smoked and was not referred to quit line.    Patient Navigation: Patient education provided. Access to services provided for patient through [place] interpreter provided. No transportation provided   Colorectal Cancer Screening: Per patient has never had colonoscopy completed No complaints today. FIT test given.   Breast and Cervical Cancer Risk Assessment: Patient does not have family history of breast cancer, known genetic mutations, or radiation treatment to the chest before age 72. Patient has history of cervical dysplasia, immunocompromised, or DES exposure in-utero.  Risk Assessment   No risk assessment data  for the current encounter  Risk Scores       07/02/2021   Last edited by: Royston Bake, CMA   5-year risk: 0.6 %   Lifetime risk: 6.7 %            A: BCCCP exam with pap smear No complaints with benign exam.   P: Referred patient to the [facility] for a screening mammogram. Appointment scheduled 09/23/22.  Melodye Ped, NP 09/23/2022 2:58 PM

## 2022-09-27 LAB — CYTOLOGY - PAP
Adequacy: ABSENT
Comment: NEGATIVE
Diagnosis: NEGATIVE
High risk HPV: NEGATIVE

## 2022-09-28 ENCOUNTER — Telehealth: Payer: Self-pay

## 2022-09-28 NOTE — Telephone Encounter (Signed)
Called patient to give pap smear results. Informed patient that pap smear was normal and HPV was negative. Based on this result and her previous hx her next pap smear will be due in 1 year. Patient voiced understanding. 

## 2023-08-17 ENCOUNTER — Other Ambulatory Visit: Payer: Self-pay

## 2023-08-17 DIAGNOSIS — Z1231 Encounter for screening mammogram for malignant neoplasm of breast: Secondary | ICD-10-CM

## 2023-10-13 ENCOUNTER — Ambulatory Visit: Payer: Self-pay | Admitting: Hematology and Oncology

## 2023-10-13 ENCOUNTER — Other Ambulatory Visit: Payer: Self-pay

## 2023-10-13 ENCOUNTER — Ambulatory Visit
Admission: RE | Admit: 2023-10-13 | Discharge: 2023-10-13 | Disposition: A | Discharge status: Home / Self Care | Payer: No Typology Code available for payment source | Source: Ambulatory Visit | Attending: Obstetrics and Gynecology | Admitting: Obstetrics and Gynecology

## 2023-10-13 VITALS — BP 127/89 | Wt 185.0 lb

## 2023-10-13 DIAGNOSIS — Z1231 Encounter for screening mammogram for malignant neoplasm of breast: Secondary | ICD-10-CM

## 2023-10-13 DIAGNOSIS — Z124 Encounter for screening for malignant neoplasm of cervix: Secondary | ICD-10-CM

## 2023-10-13 DIAGNOSIS — Z1211 Encounter for screening for malignant neoplasm of colon: Secondary | ICD-10-CM

## 2023-10-13 NOTE — Progress Notes (Signed)
Kristin Chapman is a 47 y.o. [phone] female who presents to [facility] clinic today with no complaints.    Pap Smear: Pap smear completed today. Last Pap smear was 09/23/2022 and was normal. Per patient has history of an abnormal Pap smear. Last Pap smear result is available in Epic. 07/02/2021 - LSIL/HPV-; Colposcopy 09/14/2021 - benign; Colposcopy 06/15/19 - benign   Physical exam: Breasts Breasts symmetrical. No skin abnormalities bilateral breasts. No nipple retraction bilateral breasts. No nipple discharge bilateral breasts. No lymphadenopathy. No lumps palpated bilateral breasts.     MS DIGITAL SCREENING TOMO BILATERAL  Result Date: 09/27/2022 CLINICAL DATA:  Screening. EXAM: DIGITAL SCREENING BILATERAL MAMMOGRAM WITH TOMOSYNTHESIS AND CAD TECHNIQUE: Bilateral screening digital craniocaudal and mediolateral oblique mammograms were obtained. Bilateral screening digital breast tomosynthesis was performed. The images were evaluated with computer-aided detection. COMPARISON:  Previous exam(s). ACR Breast Density Category c: The breast tissue is heterogeneously dense, which may obscure small masses. FINDINGS: There are no findings suspicious for malignancy. IMPRESSION: No mammographic evidence of malignancy. A result letter of this screening mammogram will be mailed directly to the patient. RECOMMENDATION: Screening mammogram in one year. (Code:[id number]) BI-RADS CATEGORY  1: Negative. Electronically Signed   By: Annia Belt M.D.   On: 09/27/2022 14:47   MS DIGITAL SCREENING TOMO BILATERAL  Result Date: 07/05/2021 CLINICAL DATA:  Screening. EXAM: DIGITAL SCREENING BILATERAL MAMMOGRAM WITH TOMOSYNTHESIS AND CAD TECHNIQUE: Bilateral screening digital craniocaudal and mediolateral oblique mammograms were obtained. Bilateral screening digital breast tomosynthesis was performed. The images were evaluated with computer-aided detection. COMPARISON:  Previous exam(s). ACR Breast Density Category c: The breast  tissue is heterogeneously dense, which may obscure small masses. FINDINGS: There are no findings suspicious for malignancy. IMPRESSION: No mammographic evidence of malignancy. A result letter of this screening mammogram will be mailed directly to the patient. RECOMMENDATION: Screening mammogram in one year. (Code:[id number]-01Y) BI-RADS CATEGORY  1: Negative. Electronically Signed   By: Frederico Hamman M.D.   On: 07/05/2021 07:51      Pelvic/Bimanual Ext Genitalia No lesions, no swelling and no discharge observed on external genitalia.        Vagina Vagina pink and normal texture. No lesions or discharge observed in vagina.        Cervix Cervix is present. Cervix pink and of normal texture. No discharge observed.    Uterus Uterus is present and palpable. Uterus in normal position and normal size.        Adnexae Bilateral ovaries present and palpable. No tenderness on palpation.         Rectovaginal No rectal exam completed today since patient had no rectal complaints. No skin abnormalities observed on exam.     Smoking History: Patient has never smoked and was not referred to quit line.    Patient Navigation: Patient education provided. Access to services provided for patient through BCCCP program. [place] interpreter provided. No transportation provided   Colorectal Cancer Screening: Per patient has never had colonoscopy completed No complaints today.    Breast and Cervical Cancer Risk Assessment: Patient does not have family history of breast cancer, known genetic mutations, or radiation treatment to the chest before age 2. Patient has history of cervical dysplasia, immunocompromised, or DES exposure in-utero.  Risk Scores as of Encounter on 10/13/2023     Dondra Spry           5-year 0.86%   Lifetime 9.19%   This patient is [place]/Latina but has no documented birth country, so the [place]  model used data from [place] patients to calculate their risk score. Document a birth  country in the Demographics activity for a more accurate score.         Last calculated by Caprice Red, CMA on 10/13/2023 at 11:34 AM        A: BCCCP exam with pap smear No complaints with benign exam.  P: Referred patient to the [facility] for a screening mammogram. Appointment scheduled 10/13/23.  Pascal Lux, NP 10/13/2023 11:48 AM

## 2023-10-13 NOTE — Patient Instructions (Signed)
Taught Kristin Chapman about self breast awareness and gave educational materials to take home. Patient did need a Pap smear today due to last Pap smear was in 09/23/2022 per patient.  Let her know BCCCP will cover Pap smears every 5 years unless has a history of abnormal Pap smears. Referred patient to the [facility] for screening mammogram. Appointment scheduled for 10/13/2023. Patient aware of appointment and will be there. Let patient know will follow up with her within the next couple weeks with results. Kristin Chapman verbalized understanding.  Pascal Lux, NP 11:53 AM

## 2023-10-24 LAB — CYTOLOGY - PAP
Comment: NEGATIVE
Diagnosis: NEGATIVE
High risk HPV: NEGATIVE

## 2024-04-20 ENCOUNTER — Other Ambulatory Visit: Payer: Self-pay | Admitting: Family Medicine

## 2024-04-20 ENCOUNTER — Ambulatory Visit
Admission: RE | Admit: 2024-04-20 | Discharge: 2024-04-20 | Disposition: A | Discharge status: Home / Self Care | Payer: Worker's Compensation | Source: Ambulatory Visit | Attending: Family Medicine | Admitting: Family Medicine

## 2024-04-20 DIAGNOSIS — W19XXXA Unspecified fall, initial encounter: Secondary | ICD-10-CM
